# Patient Record
Sex: Female | Born: 1970 | Race: White | Hispanic: No | Smoking: Never smoker
Health system: Southern US, Community
[De-identification: ages and names within clinical notes are randomized; demographics above are authoritative.]

## PROBLEM LIST (undated history)

## (undated) DIAGNOSIS — E079 Disorder of thyroid, unspecified: Secondary | ICD-10-CM

---

## 2007-12-28 ENCOUNTER — Emergency Department (HOSPITAL_COMMUNITY): Admission: EM | Admit: 2007-12-28 | Discharge: 2007-12-28 | Payer: Self-pay | Admitting: Emergency Medicine

## 2008-01-22 ENCOUNTER — Emergency Department (HOSPITAL_COMMUNITY): Admission: EM | Admit: 2008-01-22 | Discharge: 2008-01-22 | Payer: Self-pay | Admitting: Emergency Medicine

## 2008-01-28 ENCOUNTER — Emergency Department (HOSPITAL_COMMUNITY): Admission: EM | Admit: 2008-01-28 | Discharge: 2008-01-29 | Payer: Self-pay | Admitting: Emergency Medicine

## 2008-02-08 ENCOUNTER — Emergency Department (HOSPITAL_COMMUNITY): Admission: EM | Admit: 2008-02-08 | Discharge: 2008-02-09 | Payer: Self-pay | Admitting: Emergency Medicine

## 2008-03-29 ENCOUNTER — Emergency Department (HOSPITAL_COMMUNITY): Admission: EM | Admit: 2008-03-29 | Discharge: 2008-03-30 | Payer: Self-pay | Admitting: Emergency Medicine

## 2008-05-01 ENCOUNTER — Emergency Department (HOSPITAL_COMMUNITY): Admission: EM | Admit: 2008-05-01 | Discharge: 2008-05-02 | Payer: Self-pay | Admitting: Emergency Medicine

## 2008-05-07 ENCOUNTER — Emergency Department (HOSPITAL_COMMUNITY): Admission: EM | Admit: 2008-05-07 | Discharge: 2008-05-07 | Payer: Self-pay | Admitting: Emergency Medicine

## 2008-09-07 ENCOUNTER — Emergency Department (HOSPITAL_COMMUNITY): Admission: EM | Admit: 2008-09-07 | Discharge: 2008-09-08 | Payer: Self-pay | Admitting: Emergency Medicine

## 2008-09-20 ENCOUNTER — Emergency Department (HOSPITAL_COMMUNITY): Admission: EM | Admit: 2008-09-20 | Discharge: 2008-09-20 | Payer: Self-pay | Admitting: Emergency Medicine

## 2008-09-27 ENCOUNTER — Emergency Department (HOSPITAL_COMMUNITY): Admission: EM | Admit: 2008-09-27 | Discharge: 2008-09-28 | Payer: Self-pay | Admitting: Emergency Medicine

## 2008-09-28 ENCOUNTER — Inpatient Hospital Stay (HOSPITAL_COMMUNITY): Admission: AD | Admit: 2008-09-28 | Discharge: 2008-09-30 | Payer: Self-pay | Admitting: Psychiatry

## 2008-09-28 ENCOUNTER — Ambulatory Visit: Payer: Self-pay | Admitting: Psychiatry

## 2008-10-05 ENCOUNTER — Emergency Department (HOSPITAL_COMMUNITY): Admission: EM | Admit: 2008-10-05 | Discharge: 2008-10-06 | Payer: Self-pay | Admitting: Emergency Medicine

## 2008-10-06 ENCOUNTER — Inpatient Hospital Stay (HOSPITAL_COMMUNITY): Admission: AD | Admit: 2008-10-06 | Discharge: 2008-10-06 | Payer: Self-pay | Admitting: Obstetrics and Gynecology

## 2008-10-07 ENCOUNTER — Inpatient Hospital Stay (HOSPITAL_COMMUNITY): Admission: AD | Admit: 2008-10-07 | Discharge: 2008-10-08 | Payer: Self-pay | Admitting: Obstetrics and Gynecology

## 2008-12-12 ENCOUNTER — Emergency Department (HOSPITAL_COMMUNITY): Admission: EM | Admit: 2008-12-12 | Discharge: 2008-12-12 | Payer: Self-pay | Admitting: Family Medicine

## 2008-12-14 ENCOUNTER — Emergency Department (HOSPITAL_COMMUNITY): Admission: EM | Admit: 2008-12-14 | Discharge: 2008-12-14 | Payer: Self-pay | Admitting: Emergency Medicine

## 2008-12-22 ENCOUNTER — Emergency Department (HOSPITAL_COMMUNITY): Admission: EM | Admit: 2008-12-22 | Discharge: 2008-12-22 | Payer: Self-pay | Admitting: *Deleted

## 2008-12-26 ENCOUNTER — Emergency Department (HOSPITAL_COMMUNITY): Admission: EM | Admit: 2008-12-26 | Discharge: 2008-12-26 | Payer: Self-pay | Admitting: Emergency Medicine

## 2009-04-23 ENCOUNTER — Emergency Department (HOSPITAL_COMMUNITY): Admission: EM | Admit: 2009-04-23 | Discharge: 2009-04-24 | Payer: Self-pay | Admitting: Emergency Medicine

## 2009-08-19 ENCOUNTER — Ambulatory Visit: Payer: Self-pay | Admitting: Family Medicine

## 2009-08-21 IMAGING — CR DG CHEST 2V
2 series · 2 of 2 positions shown · non-contrast
Comparison: None.

CLINICAL DATA: Chest pain and called.  Body aches.  Fever.

CHEST - 2 VIEW

[w chest pa]
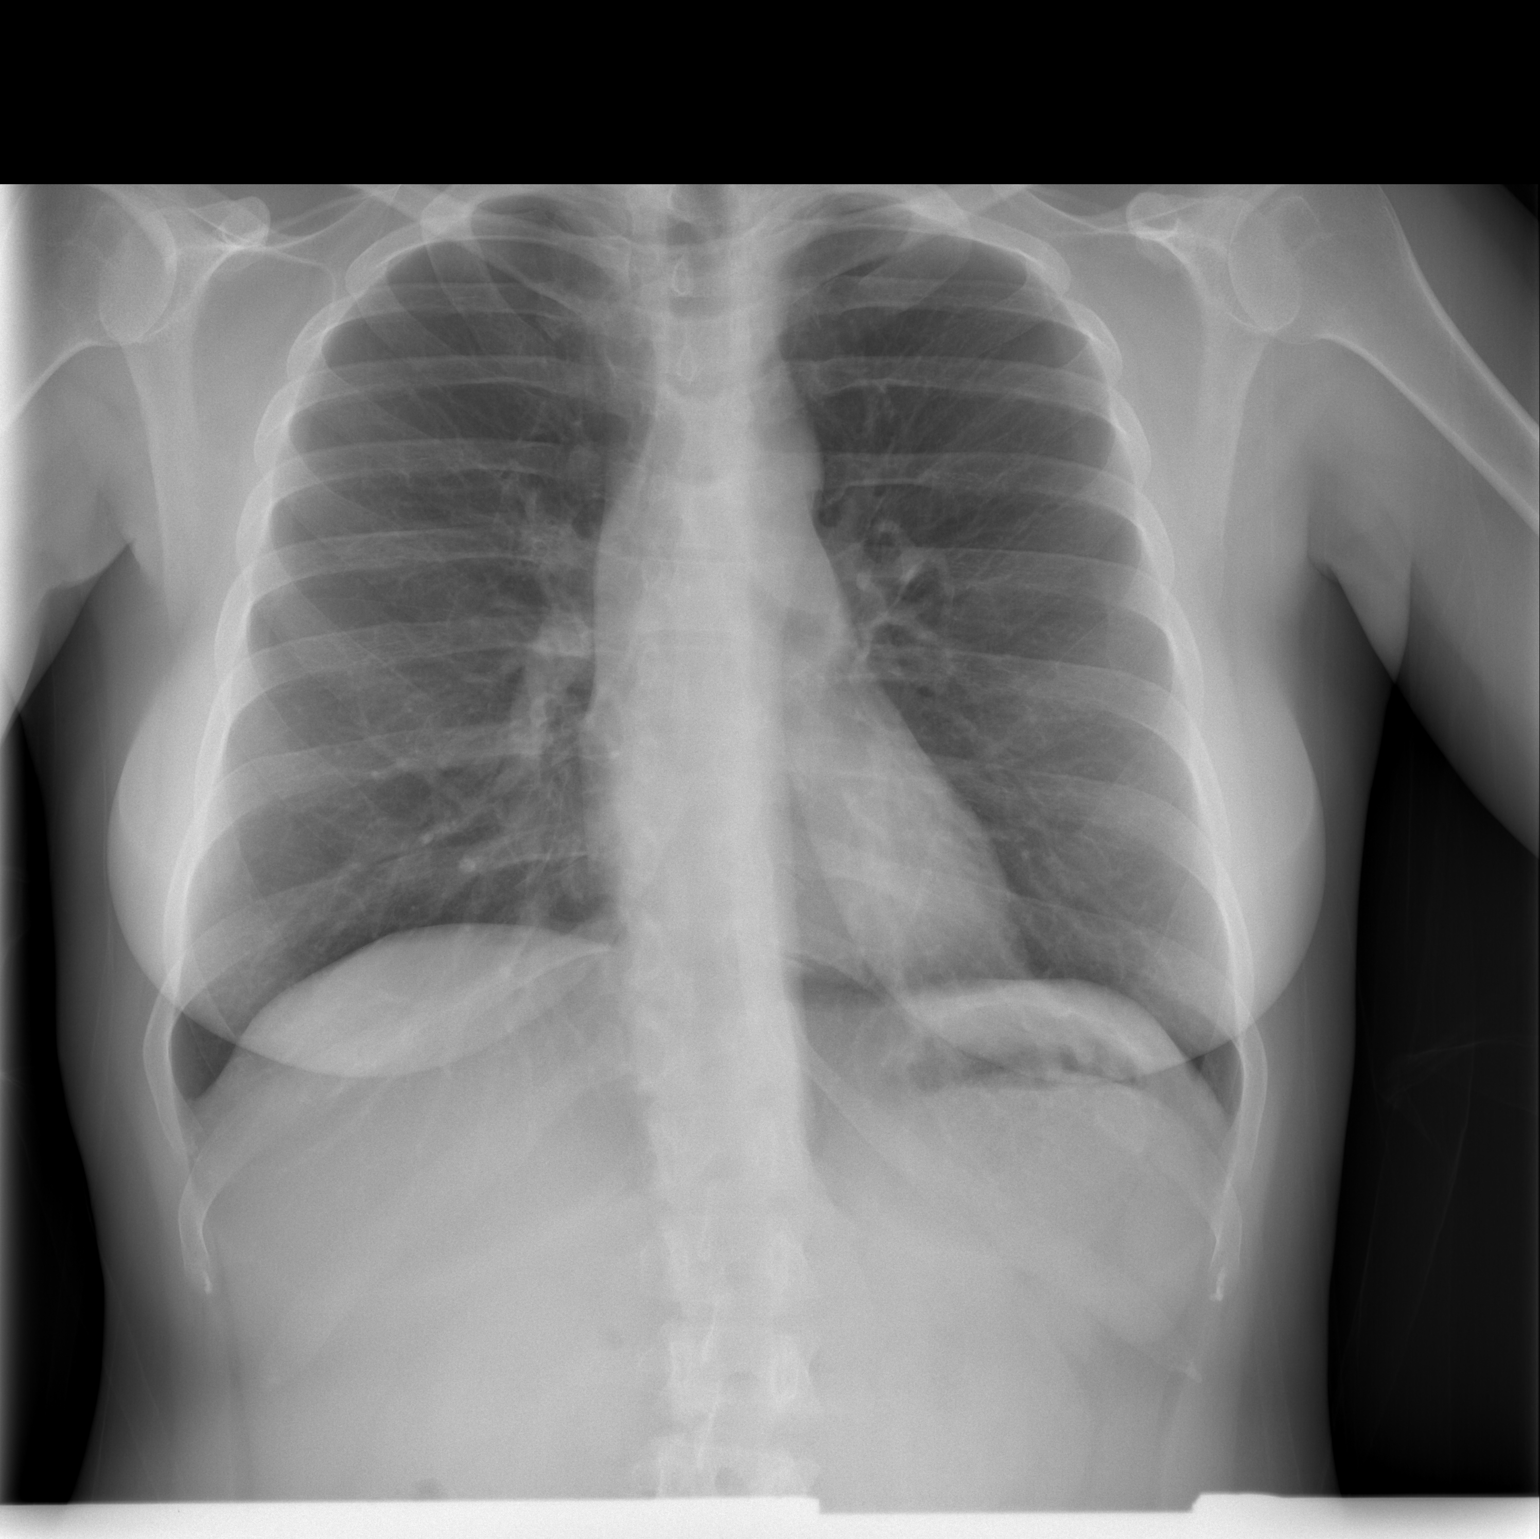

[w chest lat]
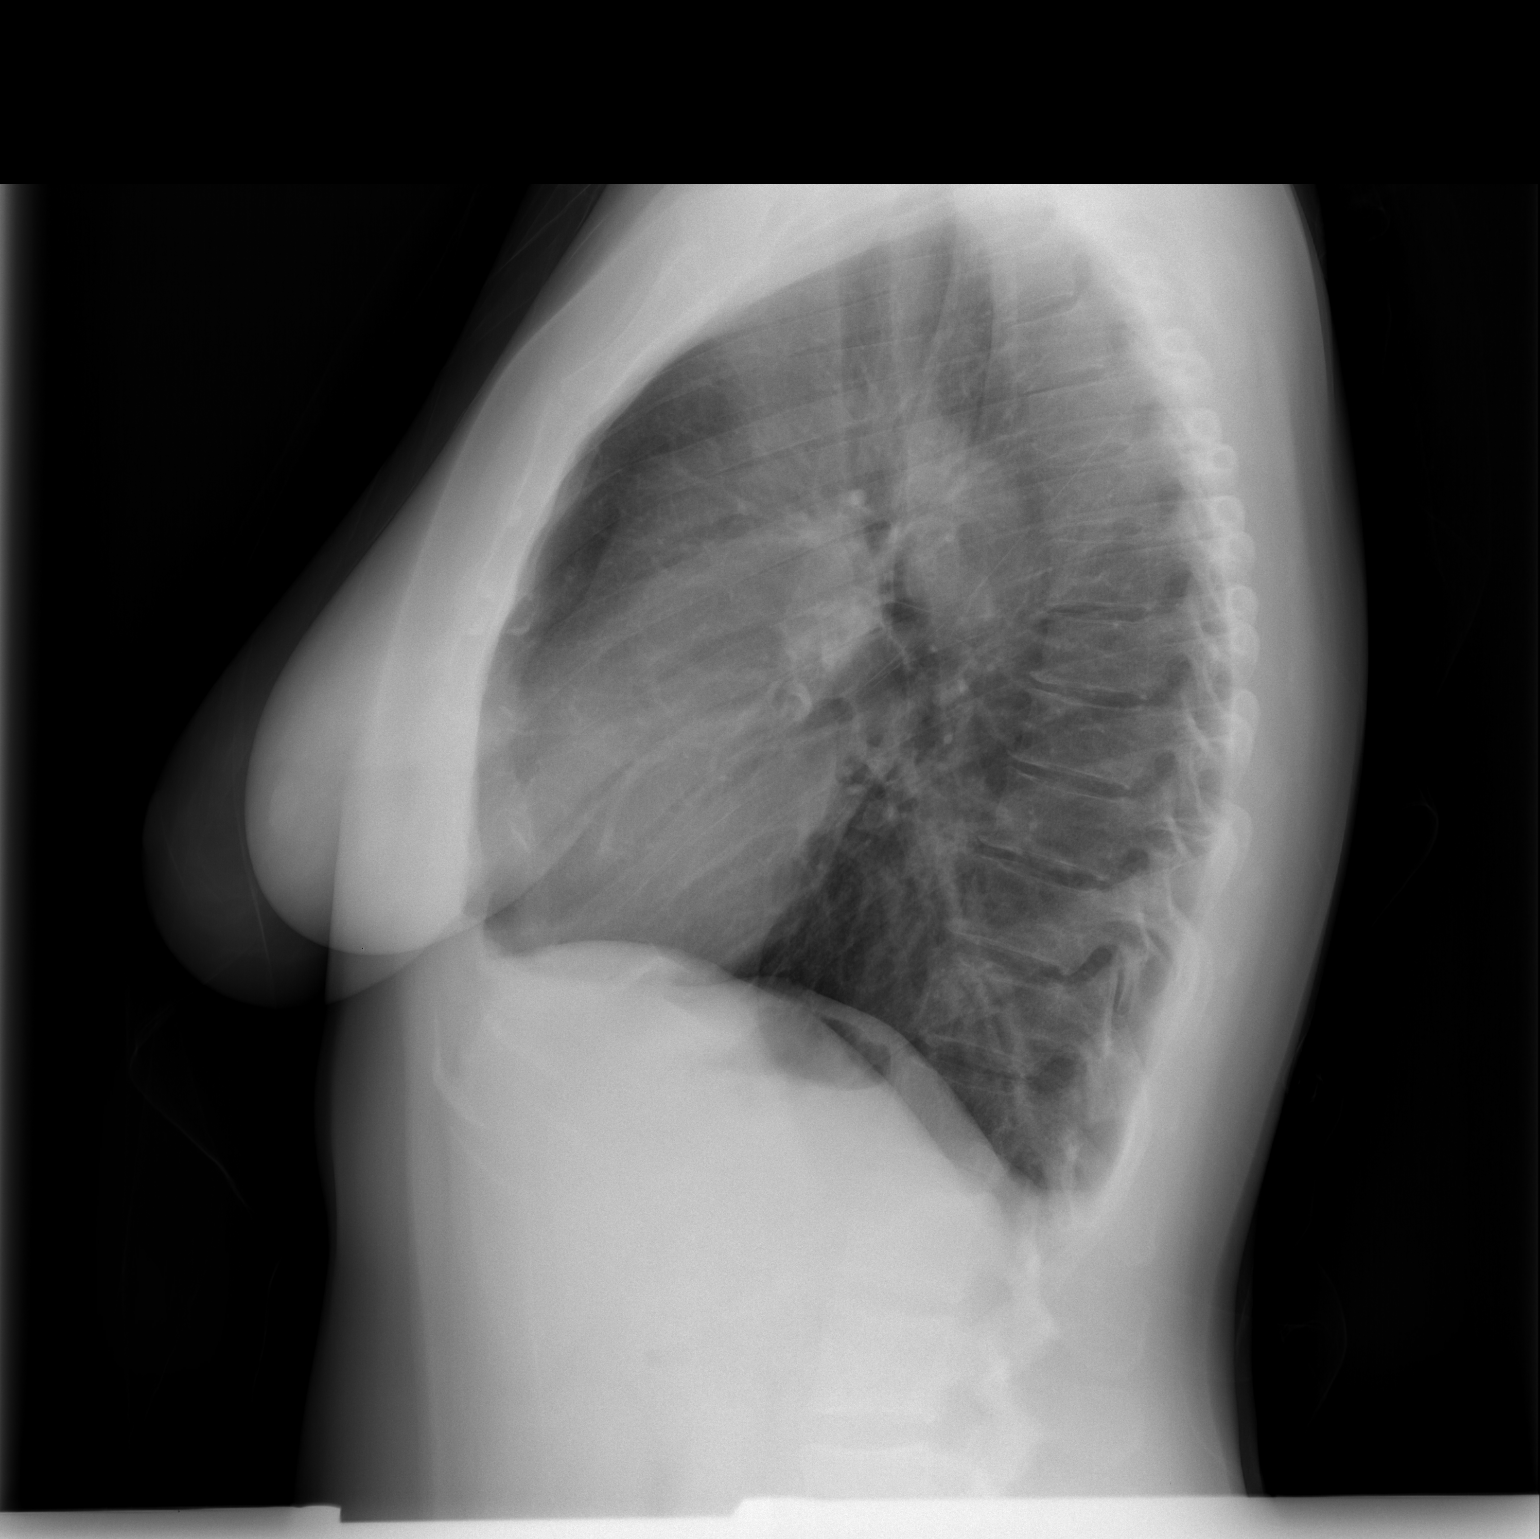

[2 of 2 positions shown; findings below may reference images not displayed]

FINDINGS: Mild dextroscoliosis of the thoracic spine.  Minimal
peribronchial thickening may represent minimal bronchitis type
changes.  No segmental infiltrate, congestive heart failure or
pneumothorax.  Heart size within normal limits.
IMPRESSION: Minimal peribronchial thickening may represent bronchitis type
changes.

No segmental infiltrate.

## 2009-09-16 ENCOUNTER — Ambulatory Visit: Payer: Self-pay | Admitting: Family Medicine

## 2009-10-14 ENCOUNTER — Ambulatory Visit: Payer: Self-pay | Admitting: Obstetrics & Gynecology

## 2009-10-14 ENCOUNTER — Ambulatory Visit (HOSPITAL_COMMUNITY): Admission: RE | Admit: 2009-10-14 | Discharge: 2009-10-14 | Payer: Self-pay | Admitting: Obstetrics & Gynecology

## 2009-10-28 ENCOUNTER — Ambulatory Visit (HOSPITAL_COMMUNITY): Admission: RE | Admit: 2009-10-28 | Discharge: 2009-10-28 | Payer: Self-pay | Admitting: Obstetrics & Gynecology

## 2009-10-28 ENCOUNTER — Ambulatory Visit: Payer: Self-pay | Admitting: Obstetrics & Gynecology

## 2009-11-18 ENCOUNTER — Ambulatory Visit: Payer: Self-pay | Admitting: Obstetrics & Gynecology

## 2009-12-09 ENCOUNTER — Ambulatory Visit: Payer: Self-pay | Admitting: Obstetrics & Gynecology

## 2009-12-09 ENCOUNTER — Encounter: Payer: Self-pay | Admitting: Obstetrics & Gynecology

## 2009-12-09 LAB — CONVERTED CEMR LAB
Hemoglobin: 11.4 g/dL — ABNORMAL LOW (ref 12.0–15.0)
RBC: 3.5 M/uL — ABNORMAL LOW (ref 3.87–5.11)
RDW: 13 % (ref 11.5–15.5)

## 2009-12-30 ENCOUNTER — Ambulatory Visit: Payer: Self-pay | Admitting: Obstetrics & Gynecology

## 2010-01-20 ENCOUNTER — Ambulatory Visit: Payer: Self-pay | Admitting: Family Medicine

## 2010-02-10 ENCOUNTER — Encounter: Payer: Self-pay | Admitting: Obstetrics & Gynecology

## 2010-02-10 ENCOUNTER — Ambulatory Visit: Payer: Self-pay | Admitting: Obstetrics and Gynecology

## 2010-02-10 LAB — CONVERTED CEMR LAB: GC Probe Amp, Genital: NEGATIVE

## 2010-02-11 ENCOUNTER — Encounter: Payer: Self-pay | Admitting: Family Medicine

## 2010-02-24 ENCOUNTER — Ambulatory Visit: Payer: Self-pay | Admitting: Obstetrics & Gynecology

## 2010-03-03 ENCOUNTER — Ambulatory Visit: Payer: Self-pay | Admitting: Obstetrics & Gynecology

## 2010-03-04 ENCOUNTER — Encounter: Payer: Self-pay | Admitting: Family Medicine

## 2010-03-04 ENCOUNTER — Ambulatory Visit (HOSPITAL_COMMUNITY): Admission: RE | Admit: 2010-03-04 | Discharge: 2010-03-04 | Payer: Self-pay | Admitting: Obstetrics & Gynecology

## 2010-03-10 ENCOUNTER — Ambulatory Visit: Payer: Self-pay | Admitting: Obstetrics and Gynecology

## 2010-03-11 ENCOUNTER — Ambulatory Visit: Payer: Self-pay | Admitting: Obstetrics & Gynecology

## 2010-03-14 ENCOUNTER — Ambulatory Visit: Payer: Self-pay | Admitting: Obstetrics & Gynecology

## 2010-03-14 ENCOUNTER — Observation Stay (HOSPITAL_COMMUNITY): Admission: AD | Admit: 2010-03-14 | Discharge: 2010-03-14 | Payer: Self-pay | Admitting: Obstetrics & Gynecology

## 2010-03-15 ENCOUNTER — Inpatient Hospital Stay (HOSPITAL_COMMUNITY): Admission: AD | Admit: 2010-03-15 | Discharge: 2010-03-17 | Payer: Self-pay | Admitting: Family Medicine

## 2010-03-15 ENCOUNTER — Ambulatory Visit: Payer: Self-pay | Admitting: Obstetrics & Gynecology

## 2010-05-28 ENCOUNTER — Emergency Department (HOSPITAL_COMMUNITY): Admission: EM | Admit: 2010-05-28 | Discharge: 2010-05-28 | Payer: Self-pay | Admitting: Family Medicine

## 2010-07-05 IMAGING — US US AMNIOCENTESIS
1 series · 3 of 3 positions shown · non-contrast
Comparison: none

OBSTETRICAL ULTRASOUND:
 This ultrasound exam was performed in the [HOSPITAL] Ultrasound Department.  The OB US report was generated in the AS system, and faxed to the ordering physician.  This report is also available in [HOSPITAL]?s Access[employer]ware and in [REDACTED] PACS.

[Series 1: us amniocentesis · 0.27mm/px · 3 of 3 slices shown]
[im 1/3]
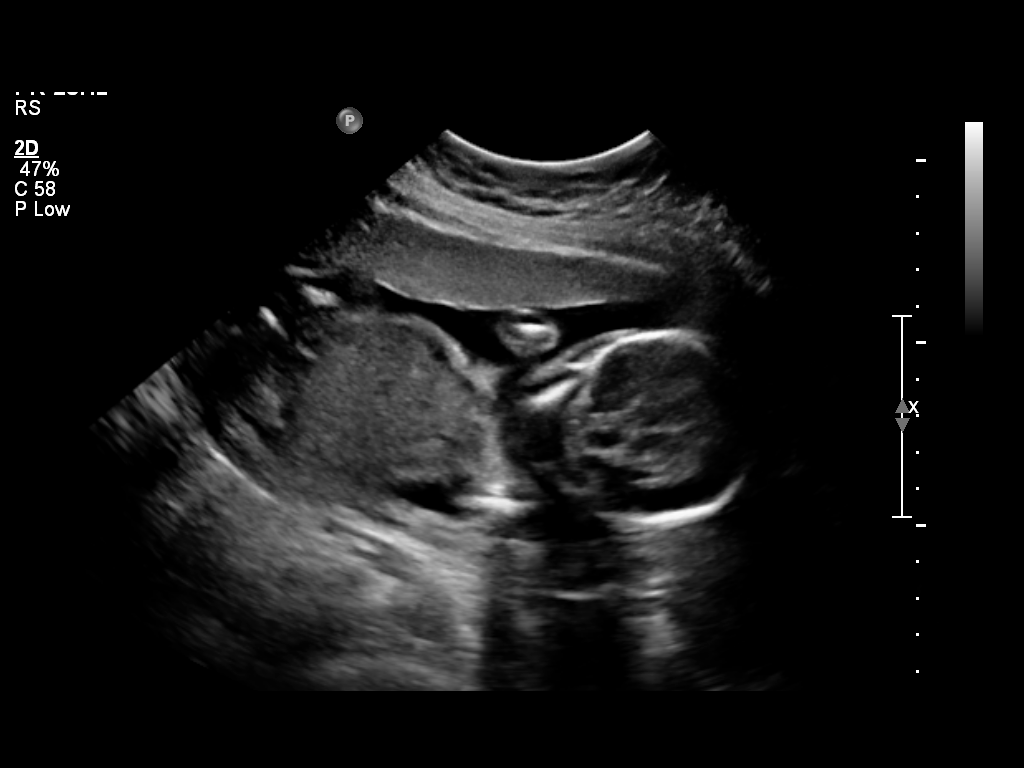
[im 2/3]
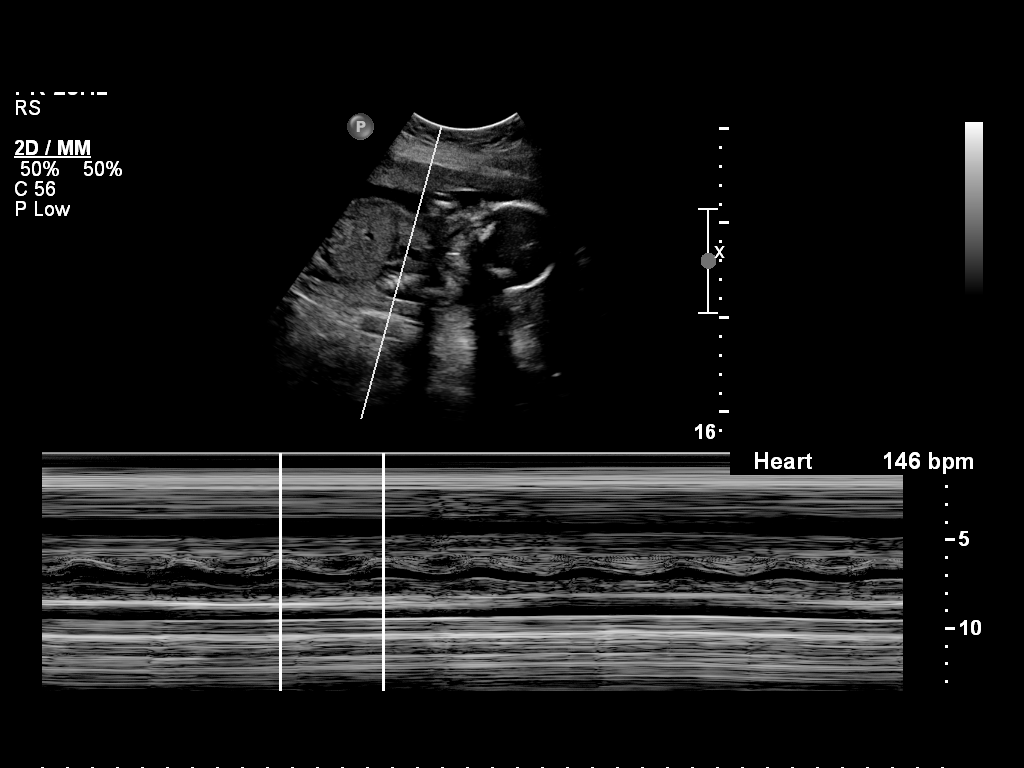
[im 3/3]
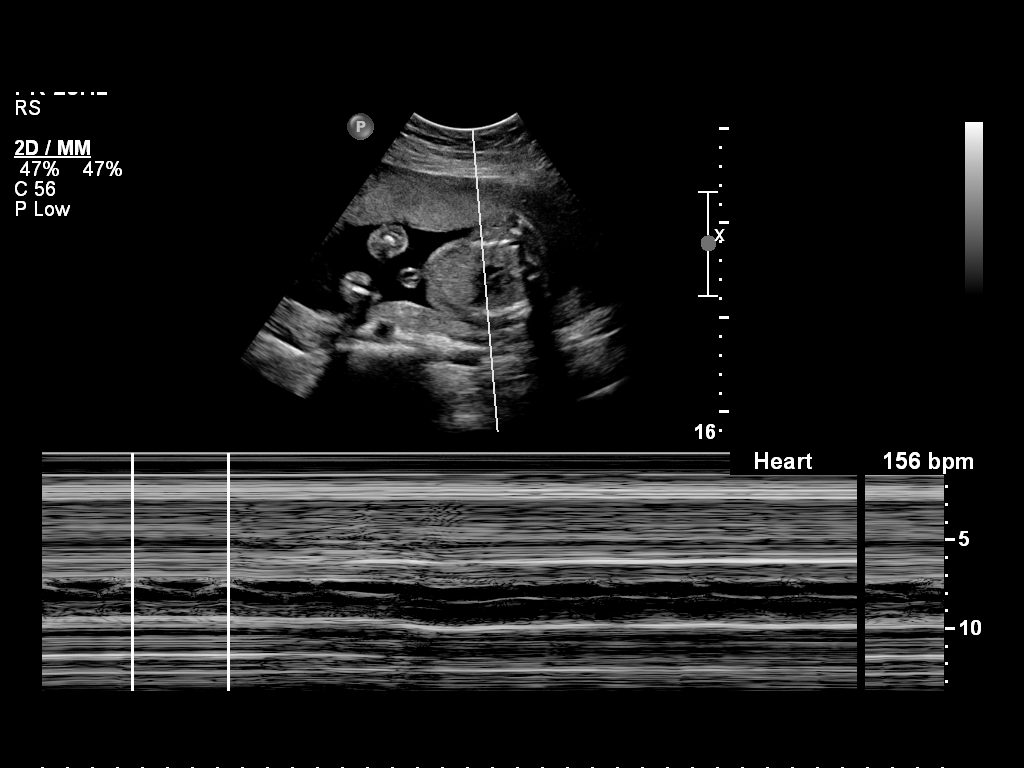

[3 of 3 positions shown; findings below may reference images not displayed]

IMPRESSION: See AS Obstetric US report.

## 2010-11-09 IMAGING — US US OB FOLLOW-UP
1 series · 14 of 27 positions shown · non-contrast
Comparison: none

OBSTETRICAL ULTRASOUND:
 This ultrasound exam was performed in the [HOSPITAL] Ultrasound Department.  The OB US report was generated in the AS system, and faxed to the ordering physician.  This report is also available in [HOSPITAL]?s [employer] and in [REDACTED] PACS.

[Series 1: us ob follow up · 0.33mm/px · 14 of 27 slices shown]
[im 1/27]
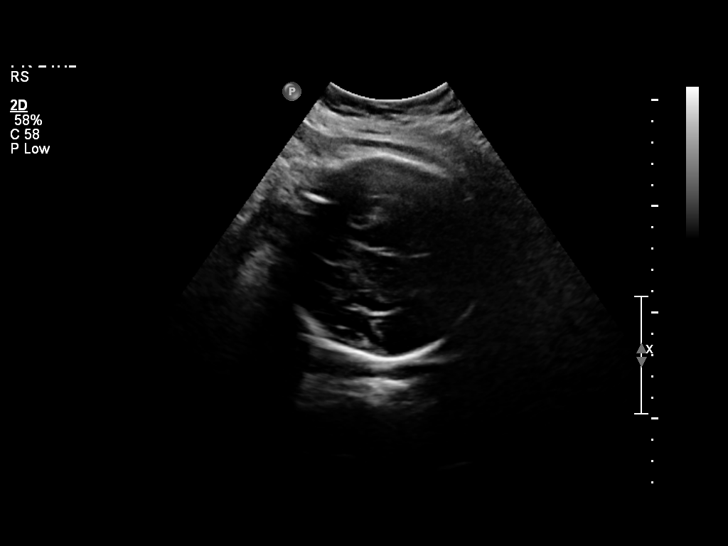
[im 3/27]
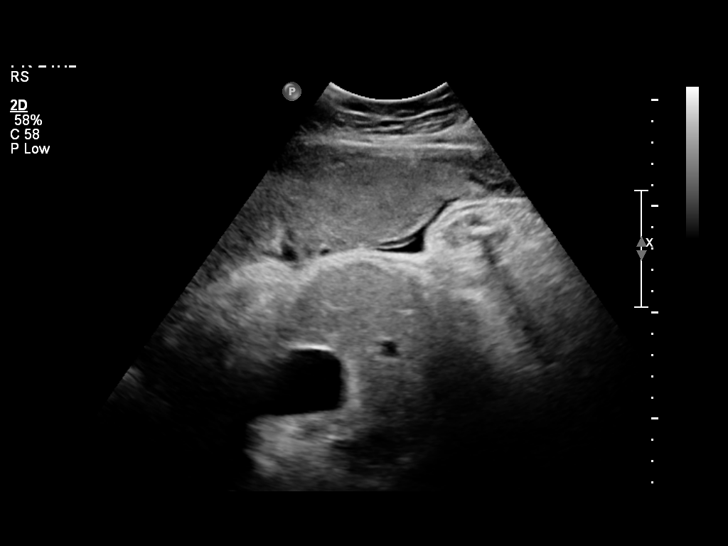
[im 5/27]
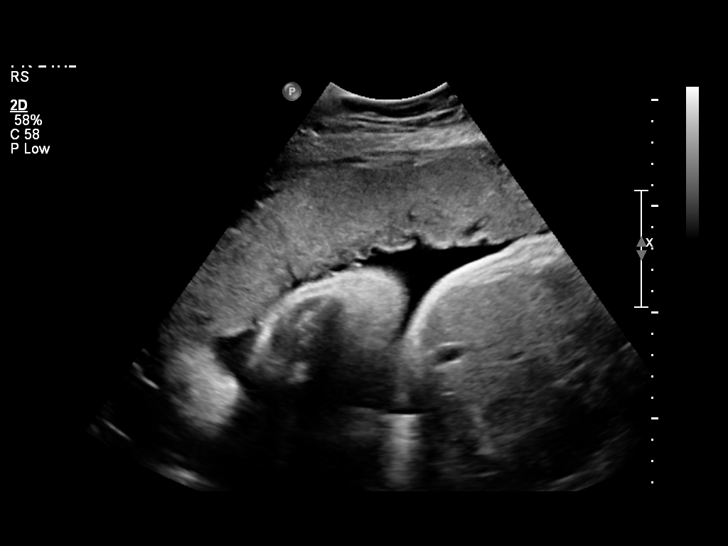
[im 7/27]
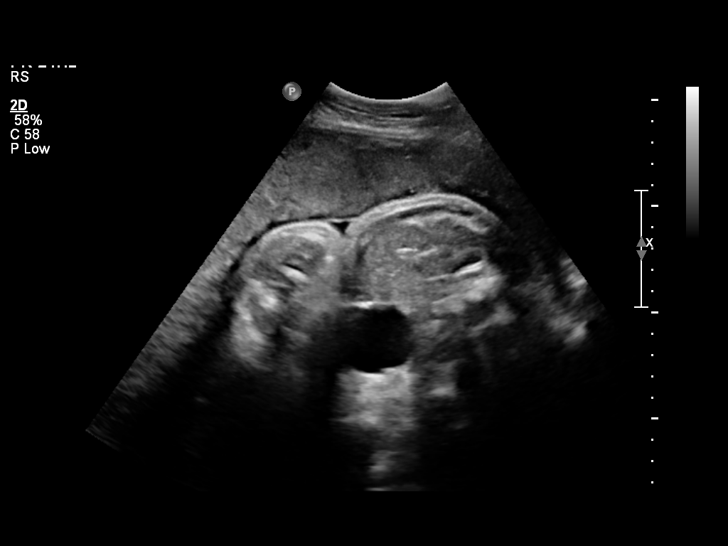
[im 9/27]
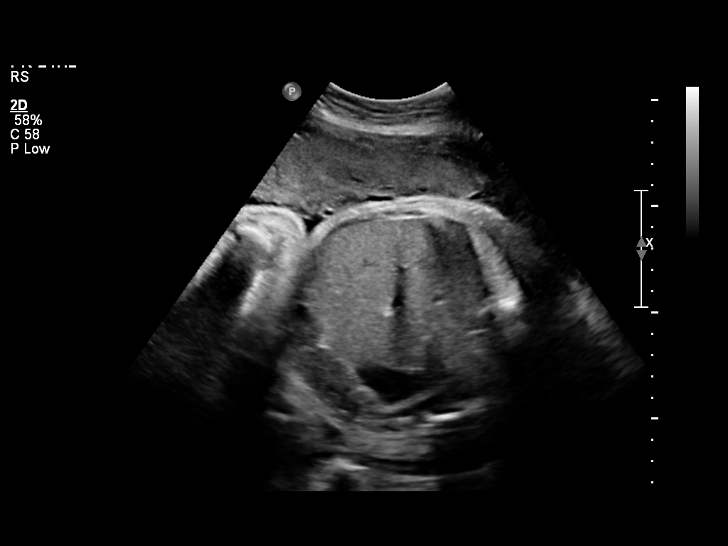
[im 11/27]
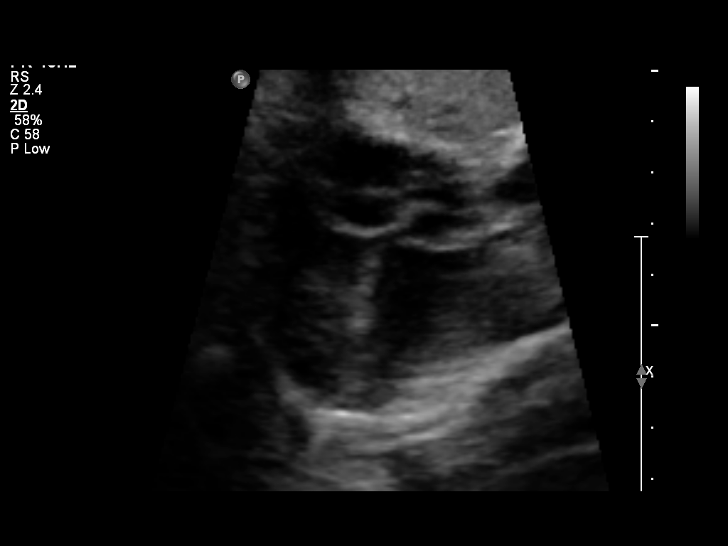
[im 13/27]
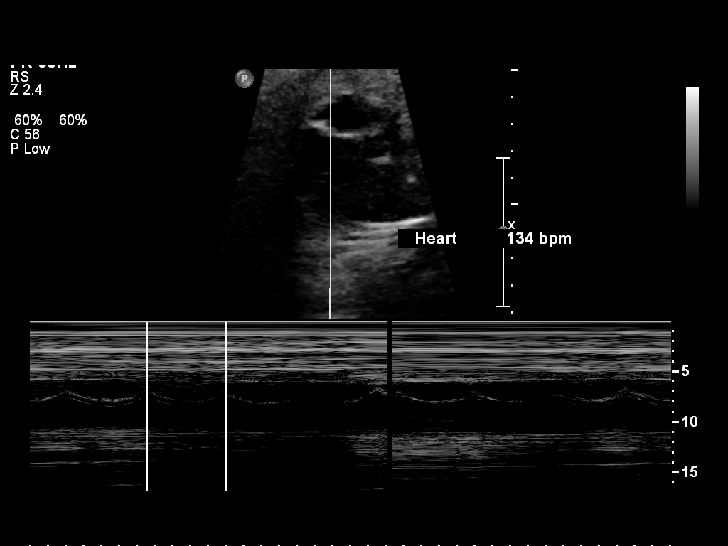
[im 15/27]
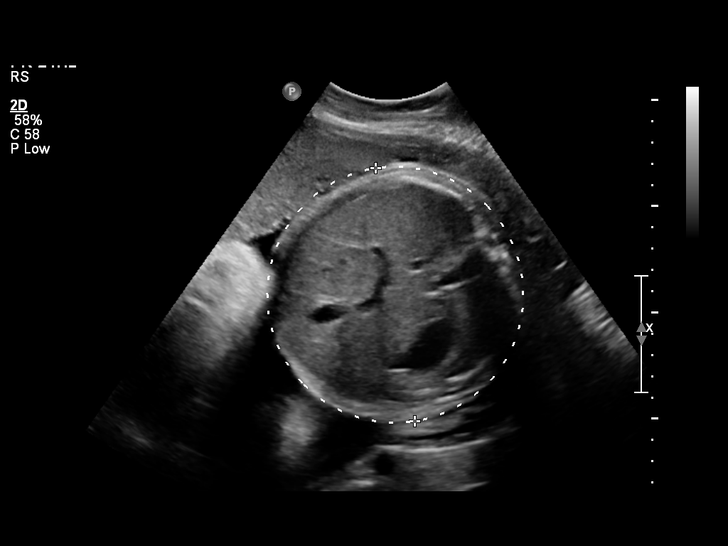
[im 17/27]
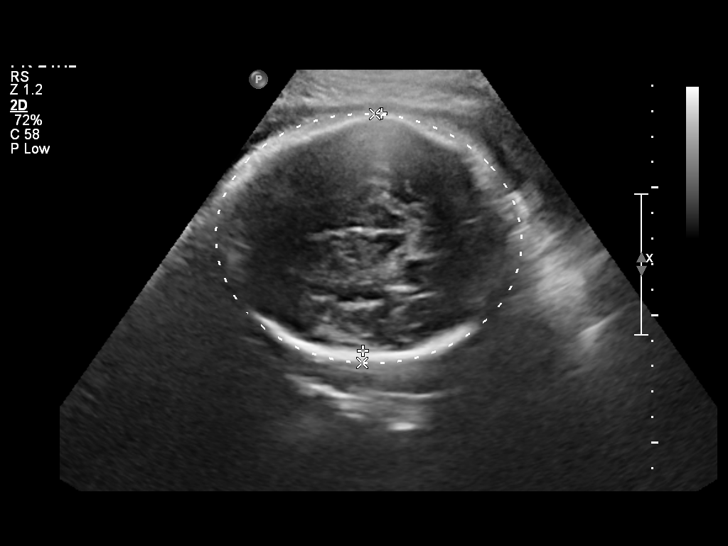
[im 19/27]
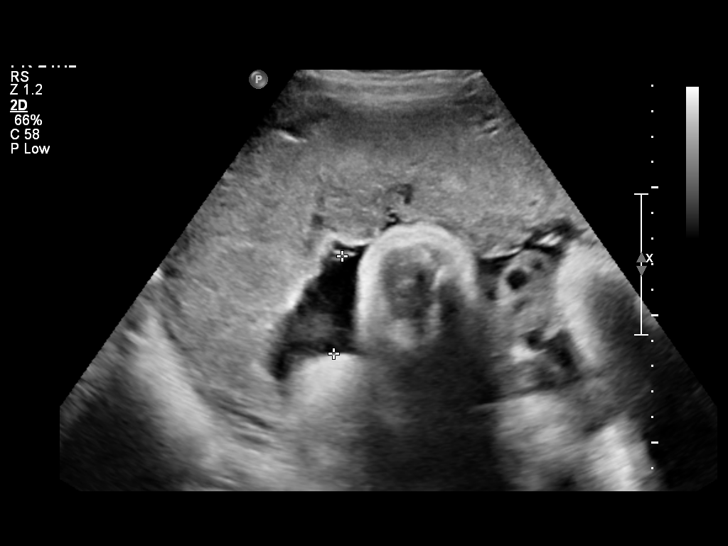
[im 21/27]
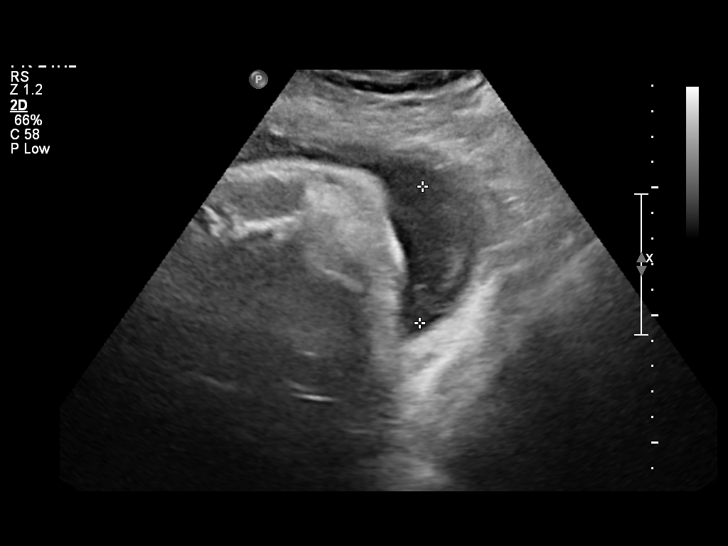
[im 23/27]
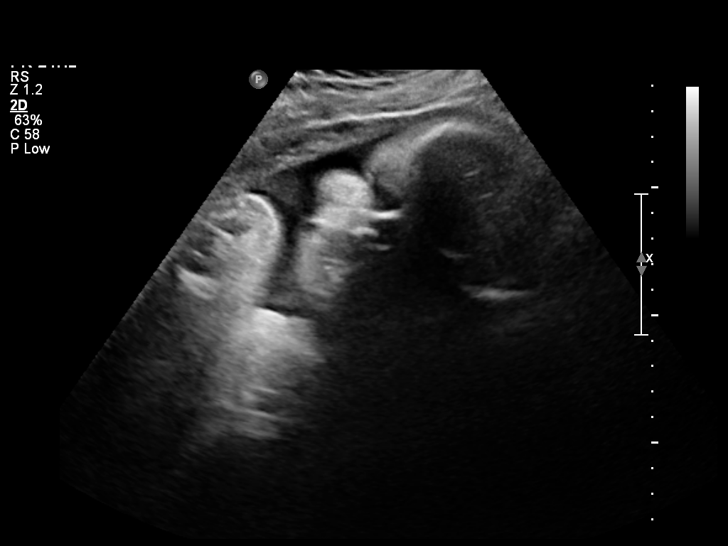
[im 25/27]
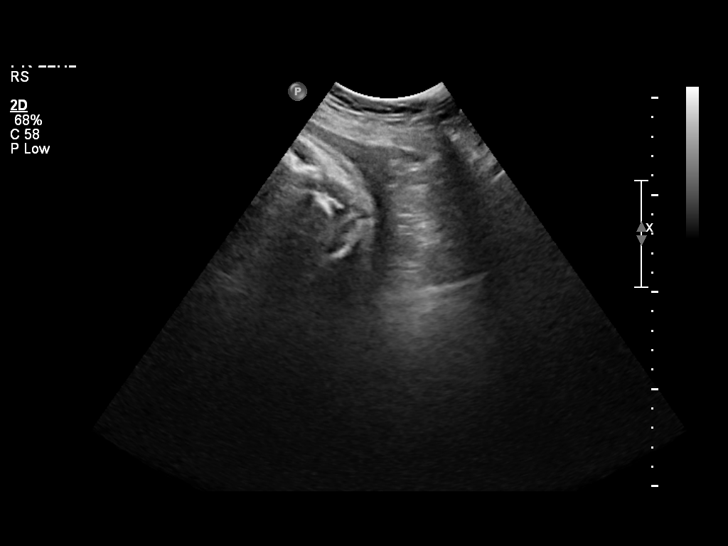
[im 27/27]
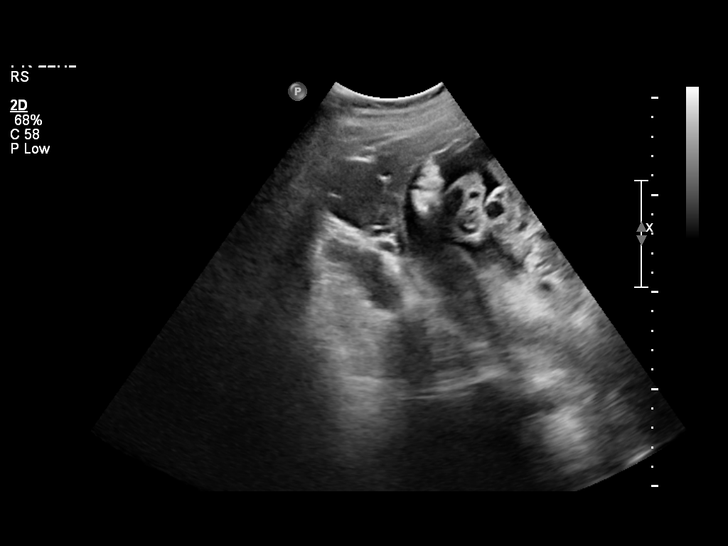

[14 of 27 positions shown; findings below may reference images not displayed]

IMPRESSION: See AS Obstetric US report.

## 2011-01-29 ENCOUNTER — Telehealth: Payer: Self-pay | Admitting: *Deleted

## 2011-01-29 ENCOUNTER — Other Ambulatory Visit: Payer: Self-pay | Admitting: Family Medicine

## 2011-01-31 NOTE — Telephone Encounter (Signed)
Issue resolved.

## 2011-03-05 LAB — POCT URINALYSIS DIP (DEVICE)
Bilirubin Urine: NEGATIVE
Glucose, UA: NEGATIVE mg/dL
Hgb urine dipstick: NEGATIVE
Ketones, ur: NEGATIVE mg/dL
pH: 6 (ref 5.0–8.0)

## 2011-03-08 LAB — POCT URINALYSIS DIP (DEVICE)
Bilirubin Urine: NEGATIVE
Hgb urine dipstick: NEGATIVE
Nitrite: NEGATIVE
Protein, ur: NEGATIVE mg/dL
Protein, ur: NEGATIVE mg/dL
Specific Gravity, Urine: 1.01 (ref 1.005–1.030)
Specific Gravity, Urine: 1.02 (ref 1.005–1.030)
pH: 6.5 (ref 5.0–8.0)

## 2011-03-13 LAB — POCT URINALYSIS DIP (DEVICE)
Bilirubin Urine: NEGATIVE
Glucose, UA: NEGATIVE mg/dL
Glucose, UA: NEGATIVE mg/dL
Glucose, UA: NEGATIVE mg/dL
Hgb urine dipstick: NEGATIVE
Ketones, ur: NEGATIVE mg/dL
Nitrite: NEGATIVE
Protein, ur: NEGATIVE mg/dL
Protein, ur: NEGATIVE mg/dL
Protein, ur: NEGATIVE mg/dL
Specific Gravity, Urine: 1.02 (ref 1.005–1.030)
Specific Gravity, Urine: 1.02 (ref 1.005–1.030)
Urobilinogen, UA: 0.2 mg/dL (ref 0.0–1.0)

## 2011-03-13 LAB — RH IMMUNE GLOB WKUP(>/=20WKS)(NOT WOMEN'S HOSP): Antibody Screen: NEGATIVE

## 2011-03-13 LAB — CBC
HCT: 33 % — ABNORMAL LOW (ref 36.0–46.0)
Hemoglobin: 10.1 g/dL — ABNORMAL LOW (ref 12.0–15.0)
Hemoglobin: 11.4 g/dL — ABNORMAL LOW (ref 12.0–15.0)
MCHC: 34.1 g/dL (ref 30.0–36.0)
MCV: 93.4 fL (ref 78.0–100.0)
Platelets: 140 10*3/uL — ABNORMAL LOW (ref 150–400)
Platelets: 141 10*3/uL — ABNORMAL LOW (ref 150–400)
RBC: 3.19 MIL/uL — ABNORMAL LOW (ref 3.87–5.11)
RBC: 3.58 MIL/uL — ABNORMAL LOW (ref 3.87–5.11)
RBC: 3.67 MIL/uL — ABNORMAL LOW (ref 3.87–5.11)
RDW: 13.2 % (ref 11.5–15.5)
RDW: 13.6 % (ref 11.5–15.5)
WBC: 10.6 10*3/uL — ABNORMAL HIGH (ref 4.0–10.5)

## 2011-03-13 LAB — RPR: RPR Ser Ql: NONREACTIVE

## 2011-03-20 LAB — POCT URINALYSIS DIP (DEVICE)
Ketones, ur: NEGATIVE mg/dL
Nitrite: NEGATIVE
Specific Gravity, Urine: 1.015 (ref 1.005–1.030)
Urobilinogen, UA: 0.2 mg/dL (ref 0.0–1.0)

## 2011-03-21 LAB — POCT URINALYSIS DIP (DEVICE)
Bilirubin Urine: NEGATIVE
Ketones, ur: NEGATIVE mg/dL
Nitrite: NEGATIVE
Protein, ur: NEGATIVE mg/dL
Urobilinogen, UA: 0.2 mg/dL (ref 0.0–1.0)

## 2011-03-22 LAB — POCT URINALYSIS DIP (DEVICE)
Glucose, UA: NEGATIVE mg/dL
Hgb urine dipstick: NEGATIVE

## 2011-03-22 LAB — CHROMOSOME ANALYSIS, AMNIOTIC FLUID (PERF AT WFU)

## 2011-03-23 LAB — POCT URINALYSIS DIP (DEVICE)
Ketones, ur: NEGATIVE mg/dL
Protein, ur: NEGATIVE mg/dL
Specific Gravity, Urine: 1.015 (ref 1.005–1.030)

## 2011-03-24 LAB — POCT URINALYSIS DIP (DEVICE)
Bilirubin Urine: NEGATIVE
Glucose, UA: NEGATIVE mg/dL
Hgb urine dipstick: NEGATIVE
Hgb urine dipstick: NEGATIVE
Ketones, ur: NEGATIVE mg/dL
Specific Gravity, Urine: 1.025 (ref 1.005–1.030)
Specific Gravity, Urine: <=1.005 (ref 1.005–1.030)
Urobilinogen, UA: 0.2 mg/dL (ref 0.0–1.0)
pH: 6 (ref 5.0–8.0)

## 2011-03-28 LAB — DIFFERENTIAL
Basophils Absolute: 0 10*3/uL (ref 0.0–0.1)
Basophils Relative: 1 % (ref 0–1)
Eosinophils Absolute: 0 10*3/uL (ref 0.0–0.7)
Eosinophils Relative: 0 % (ref 0–5)
Lymphocytes Relative: 43 % (ref 12–46)
Lymphs Abs: 2 10*3/uL (ref 0.7–4.0)
Monocytes Absolute: 0.2 10*3/uL (ref 0.1–1.0)
Monocytes Relative: 4 % (ref 3–12)
Neutro Abs: 2.4 10*3/uL (ref 1.7–7.7)
Neutrophils Relative %: 52 % (ref 43–77)

## 2011-03-28 LAB — URINE MICROSCOPIC-ADD ON

## 2011-03-28 LAB — CBC
HCT: 36.7 % (ref 36.0–46.0)
Hemoglobin: 12.8 g/dL (ref 12.0–15.0)
MCHC: 34.8 g/dL (ref 30.0–36.0)
MCV: 101.7 fL — ABNORMAL HIGH (ref 78.0–100.0)
Platelets: 279 10*3/uL (ref 150–400)
RBC: 3.61 MIL/uL — ABNORMAL LOW (ref 3.87–5.11)
RDW: 17.2 % — ABNORMAL HIGH (ref 11.5–15.5)
WBC: 4.5 10*3/uL (ref 4.0–10.5)

## 2011-03-28 LAB — RAPID URINE DRUG SCREEN, HOSP PERFORMED
Barbiturates: NOT DETECTED
Opiates: NOT DETECTED

## 2011-03-28 LAB — URINALYSIS, ROUTINE W REFLEX MICROSCOPIC
Bilirubin Urine: NEGATIVE
Glucose, UA: NEGATIVE mg/dL
Specific Gravity, Urine: 1.006 (ref 1.005–1.030)
pH: 6 (ref 5.0–8.0)

## 2011-03-28 LAB — COMPREHENSIVE METABOLIC PANEL
CO2: 12 mEq/L — ABNORMAL LOW (ref 19–32)
Calcium: 9 mg/dL (ref 8.4–10.5)
Glucose, Bld: 70 mg/dL (ref 70–99)
Sodium: 141 mEq/L (ref 135–145)

## 2011-04-03 LAB — COMPREHENSIVE METABOLIC PANEL
AST: 29 U/L (ref 0–37)
Alkaline Phosphatase: 102 U/L (ref 39–117)
BUN: 6 mg/dL (ref 6–23)
BUN: 4 mg/dL — ABNORMAL LOW (ref 6–23)
CO2: 23 mEq/L (ref 19–32)
Chloride: 106 mEq/L (ref 96–112)
Creatinine, Ser: 0.76 mg/dL (ref 0.4–1.2)
GFR calc Af Amer: >60 mL/min (ref >60)
GFR calc non Af Amer: >60 mL/min (ref >60)
GFR calc non Af Amer: >60 mL/min (ref >60)
Glucose, Bld: 100 mg/dL — ABNORMAL HIGH (ref 70–99)
Glucose, Bld: 63 mg/dL — ABNORMAL LOW (ref 70–99)
Potassium: 3.6 mEq/L (ref 3.5–5.1)
Total Bilirubin: 0.5 mg/dL (ref 0.3–1.2)
Total Protein: 6.8 g/dL (ref 6.0–8.3)

## 2011-04-03 LAB — URINE MICROSCOPIC-ADD ON

## 2011-04-03 LAB — PREGNANCY, URINE: Preg Test, Ur: NEGATIVE

## 2011-04-03 LAB — DIFFERENTIAL
Basophils Absolute: 0 10*3/uL (ref 0.0–0.1)
Basophils Absolute: 0 10*3/uL (ref 0.0–0.1)
Basophils Relative: 1 % (ref 0–1)
Eosinophils Relative: 1 % (ref 0–5)
Lymphocytes Relative: 45 % (ref 12–46)
Monocytes Relative: 3 % (ref 3–12)
Neutro Abs: 4.4 10*3/uL (ref 1.7–7.7)
Neutrophils Relative %: 51 % (ref 43–77)
Neutrophils Relative %: 66 % (ref 43–77)

## 2011-04-03 LAB — CBC
HCT: 44.6 % (ref 36.0–46.0)
HCT: 40.8 % (ref 36.0–46.0)
Hemoglobin: 15.7 g/dL — ABNORMAL HIGH (ref 12.0–15.0)
Hemoglobin: 13.8 g/dL (ref 12.0–15.0)
MCHC: 35.3 g/dL (ref 30.0–36.0)
MCHC: 33.9 g/dL (ref 30.0–36.0)
MCV: 91.8 fL (ref 78.0–100.0)
RBC: 4.86 MIL/uL (ref 3.87–5.11)
RDW: 14.5 % (ref 11.5–15.5)
WBC: 8 10*3/uL (ref 4.0–10.5)

## 2011-04-03 LAB — URINALYSIS, ROUTINE W REFLEX MICROSCOPIC
Bilirubin Urine: NEGATIVE
Bilirubin Urine: NEGATIVE
Glucose, UA: NEGATIVE mg/dL
Ketones, ur: 40 mg/dL — AB
Nitrite: NEGATIVE
Protein, ur: NEGATIVE mg/dL
Specific Gravity, Urine: 1.004 — ABNORMAL LOW (ref 1.005–1.030)
pH: 7 (ref 5.0–8.0)

## 2011-04-03 LAB — TRICYCLICS SCREEN, URINE: TCA Scrn: NOT DETECTED

## 2011-04-03 LAB — ETHANOL: Alcohol, Ethyl (B): 358 mg/dL — ABNORMAL HIGH (ref 0–10)

## 2011-04-03 LAB — RAPID URINE DRUG SCREEN, HOSP PERFORMED
Barbiturates: NOT DETECTED
Cocaine: NOT DETECTED
Cocaine: NOT DETECTED
Opiates: NOT DETECTED

## 2011-04-03 LAB — LIPASE, BLOOD: Lipase: 35 U/L (ref 11–59)

## 2011-05-02 NOTE — H&P (Signed)
Penny Hill, Penny Hill                  ACCOUNT NO.:  [id number]   MEDICAL RECORD NO.:  [id number]          PATIENT TYPE:  IPS   LOCATION:  0507                          FACILITY:  [facility]   PHYSICIAN:  Landry Corporal, N.P.    DATE OF BIRTH:  October 27, 1971   DATE OF ADMISSION:  09/28/2008  DATE OF DISCHARGE:                       PSYCHIATRIC ADMISSION ASSESSMENT   HISTORY OF PRESENT ILLNESS:  The patient presents with a history of  alcohol abuse, has been drinking up to fifth at a time, is currently  pregnant, wanting to be detoxed.  Her last drink was yesterday.  She  does report a history of seizures with alcohol withdrawal.  She does  report that she fell about a week ago and had hurt her abdomen, and did  seek treatment in the emergency department.  She denies any thoughts to  harm herself or her child.  She denies any drug use and again is  requesting help with her alcohol problems.   PAST PSYCHIATRIC HISTORY:  First admission to the [facility], has been in [facility] in the past, and was detoxed back in  June.  Her longest history of sobriety has been 3 years.  The patient  apparently was drinking some before she was pregnant and has done some  recent binge drinking.   SOCIAL HISTORY:  A 40 year old divorced female who has a fiance that she  states is supportive.  She is unemployed.  She lives in [place].  She  has a 88-year-old daughter that is in her parents' custody.   FAMILY HISTORY:  None.   ALCOHOL/DRUG HISTORY:  As above.   PRIMARY CARE Wyeth Hoffer:  [facility] practice in [place].   MEDICAL PROBLEMS:  The patient is approximately 6-[redacted] weeks pregnant and  has not had her first prenatal visit.   MEDICATIONS:  Prenatal vitamins, no other medications.   DRUG ALLERGIES:  Levaquin.   PHYSICAL EXAMINATION:  The patient appears in no acute distress at the  time she was assessed at the [facility] emergency department.  She did  receive IV fluids,  some Ativan, and a breathing treatment.  Reported  that the patient had a history of wheezing.  Her O2 sat at this time is  100%.  Temperature 98.2, heart rate 82, respirations 12, blood pressure  115/49, 100% saturated.  Alcohol level was 296.  Her liver enzymes were  within normal limits.  Urine drug screen was negative.   MENTAL STATUS EXAM:  The patient is resting at this time in bed, does  awaken easily, cooperative with the interview.  Speech is soft spoken,  but clear.  The patient's mood is neutral.  The patient's affect is  sleepy and in no acute distress.  Thought processes are coherent and  goal-directed.  No evidence of any suicidal thoughts.  No delusional  statements.  Cognitive function intact.  Her memory appears to be good.  Judgment and insight are fair.   DIAGNOSES:  AXIS I:  Alcohol intoxication, rule out alcohol dependence.  AXIS II:  Deferred.  AXIS III:  Intrauterine pregnancy  at 6-8 weeks.  AXIS IV:  Psychosocial problems with chronic substance use.  Currently  pregnant.  AXIS V:  Current is 39.   PLAN:  Detox the patient.  Librium protocol.  We did call [facility] to obtain recommendations.  Spoke to the nurse practitioner who  talked to Dr. Penne Lash and Dr. Pierre Bali who agreed that the Librium  protocol would be recommended for detox.  The patient will be in the red  group.  We will continue to assess co-morbidities, assess support,  consider family session with the patient's fiance.  The patient will be  encouraged to follow up with her prenatal care with [facility]  practice.  Her tentative length of stay at this time is 3-4 days.  We  will put the patient on seizure precautions.  Length of stay is 3-4  days.      Landry Corporal, N.P.     JO/MEDQ  D:  09/28/2008  T:  09/28/2008  Job:  [id number]

## 2011-05-05 NOTE — Discharge Summary (Signed)
Penny Hill, Penny Hill                  ACCOUNT NO.:  [id number]   MEDICAL RECORD NO.:  [id number]         PATIENT TYPE:  BIPS   LOCATION:                                FACILITY:  [facility]   PHYSICIAN:  Geoffery Lyons, M.D.      DATE OF BIRTH:  [birth year]   DATE OF ADMISSION:  09/28/2008  DATE OF DISCHARGE:  09/30/2008                               DISCHARGE SUMMARY   CHIEF COMPLAINT:  This was the first admission to [facility] for this 40 year old female who presented with a history of  alcohol dependence.  Had been drinking up to a fifth at that time,  currently pregnant, wanting to be detoxed.  Last drink was the day  before.   PAST MEDICAL HISTORY:  Seizures with alcohol withdrawal.  Does report  that she fell about a week prior to this admission and had hurt her  abdomen and did seek treatment in the ED.  Requesting help for her  alcohol problem.  First time at [facility].  Has been in  [facility] and was detoxed back in June.  Longest sobriety 3 years.  Was drinking some before she was pregnant and has done some recent binge  drinking.  Secondary history as already stated.  Persistent use of  alcohol.   MEDICAL HISTORY:  She is 6 to [redacted] weeks pregnant.   MEDICATIONS:  Prenatal vitamins.   Physical exam failed to show any acute findings.   LABORATORY WORK:  Alcohol level was 296.  Liver enzymes were within  normal limits.  UDS point negative for substances of abuse.   PHYSICAL EXAMINATION:  PSYCHIATRIC:  Reveals alert cooperative female.  Speech was soft spoken, clear.  The patient is anxious.  She is tired.  Thought processes logical, coherent and relevant.  Wants to quit  drinking.  Concerned about the baby.  No active suicidal or homicidal  ideas, no delusions.  No hallucinations.  Cognition, well preserved.   ADMITTING DIAGNOSES:  AXIS I:  Alcohol dependence.  AXIS II:  No diagnosis.  AXIS III:  In intrauterine pregnancy 6 to 8 weeks.  AXIS IV:   Moderate.  AXIS V:  On admission 35-40.  Highest in the last year 60.   COURSE IN THE HOSPITAL:  She was admitted, started individual and group  psychotherapy.  We detoxified with Librium.  We maintained the vitamins.  As already stated, a 40 year old female, [redacted] weeks pregnant who endorsed  that she relapsed on alcohol.  Said that she was under a lot of stress.  She took a drink and continued drinking for several days.  She says she  got back to her senses as she realized she was pregnant and she really  wants this baby.  The fiancee is supportive.  Claims is a good  relationship.  Claimed that the stress was secondary to her 25-year-old  daughter being with her mother.  She gave her parents temporary custody  of her daughter after she went to [facility].  Also unemployed.  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  She  was seen [facility] a year ago with halfway house afterward.  Did  admit to depressed mood.  Also anxiety, worries, ruminating.  October  13, she endorsed she was feeling better.  She states she was committed  to abstinence.  Wanted to get herself back together, get her life with  fiancee and daughter together.  Looking back she says that once she  starts drinking, she is afraid that she is going to have seizures and  that is what keeps her drinking.  She was wanting to go home.  There  were no overt symptoms of withdrawal.  We extended the Librium detox.  A  family session with the fiancee they both agree to seek treatment.  We  went ahead and discharged to outpatient followup.   DISCHARGE DIAGNOSES:  AXIS I:  Alcohol dependence, substance-induced  anxiety and depression.  AXIS II:  No diagnosis.  AXIS III:  A 6 to 8 week intrauterine pregnancy.  AXIS IV:  Moderate.  AXIS V:  On discharge 50.   DISCHARGE MEDICATIONS:  Prenatal vitamin.  Librium to complete Librium  detox on an outpatient basis.      Geoffery Lyons, M.D.   Electronically Signed     IL/MEDQ  D:  10/18/2008  T:  10/18/2008  Job:  [id number]

## 2011-09-07 LAB — RAPID URINE DRUG SCREEN, HOSP PERFORMED
Barbiturates: NOT DETECTED
Cocaine: NOT DETECTED
Opiates: NOT DETECTED
Tetrahydrocannabinol: NOT DETECTED

## 2011-09-07 LAB — COMPREHENSIVE METABOLIC PANEL
Albumin: 3.7
Alkaline Phosphatase: 78
BUN: 7
CO2: 26
Chloride: 99
Creatinine, Ser: 0.67
GFR calc non Af Amer: >60
Glucose, Bld: 92
Potassium: 3 — ABNORMAL LOW
Total Bilirubin: 0.7

## 2011-09-07 LAB — ETHANOL
Alcohol, Ethyl (B): 331 — ABNORMAL HIGH
Alcohol, Ethyl (B): 483

## 2011-09-08 LAB — DIFFERENTIAL
Basophils Absolute: 0.1
Basophils Relative: 1
Eosinophils Absolute: 0
Eosinophils Absolute: 0
Eosinophils Absolute: 0
Eosinophils Relative: 0
Lymphocytes Relative: 50 — ABNORMAL HIGH
Lymphocytes Relative: 37
Lymphs Abs: 2.2
Lymphs Abs: 3.3
Lymphs Abs: 2
Monocytes Absolute: 0.2
Monocytes Relative: 3
Monocytes Relative: 6
Neutro Abs: 1.9
Neutro Abs: 3
Neutro Abs: 3
Neutrophils Relative %: 43
Neutrophils Relative %: 45
Neutrophils Relative %: 57

## 2011-09-08 LAB — COMPREHENSIVE METABOLIC PANEL
ALT: 54 — ABNORMAL HIGH
ALT: 106 — ABNORMAL HIGH
AST: 130 — ABNORMAL HIGH
AST: 158 — ABNORMAL HIGH
Albumin: 3.3 — ABNORMAL LOW
Albumin: 3.5
Alkaline Phosphatase: 161 — ABNORMAL HIGH
BUN: 4 — ABNORMAL LOW
BUN: 7
CO2: 27
CO2: 29
Calcium: 8 — ABNORMAL LOW
Calcium: 8.1 — ABNORMAL LOW
Calcium: 8.3 — ABNORMAL LOW
Chloride: 98
Creatinine, Ser: 0.72
Creatinine, Ser: 0.85
Creatinine, Ser: 0.63
GFR calc Af Amer: >60
GFR calc Af Amer: >60
GFR calc non Af Amer: >60
GFR calc non Af Amer: >60
Glucose, Bld: 70
Glucose, Bld: 89
Potassium: 3.8
Sodium: 138
Sodium: 145
Total Bilirubin: 0.8
Total Protein: 5.9 — ABNORMAL LOW
Total Protein: 6.6

## 2011-09-08 LAB — URINALYSIS, ROUTINE W REFLEX MICROSCOPIC
Bilirubin Urine: NEGATIVE
Bilirubin Urine: NEGATIVE
Glucose, UA: NEGATIVE
Glucose, UA: NEGATIVE
Hgb urine dipstick: NEGATIVE
Ketones, ur: NEGATIVE
Ketones, ur: NEGATIVE
Ketones, ur: NEGATIVE
Nitrite: NEGATIVE
Nitrite: POSITIVE — AB
Protein, ur: 30 — AB
Protein, ur: NEGATIVE
Protein, ur: NEGATIVE
Specific Gravity, Urine: 1.016
Urobilinogen, UA: 1
pH: 6.5
pH: 7.5

## 2011-09-08 LAB — CBC
HCT: 42.1
Hemoglobin: 14.8
Hemoglobin: 13.2
MCHC: 35.1
MCV: 87.6
MCV: 89.2
Platelets: 102 — ABNORMAL LOW
Platelets: 161
RBC: 4.7
RBC: 4.73
RBC: 4.16
RDW: 17.5 — ABNORMAL HIGH
RDW: 20.7 — ABNORMAL HIGH
WBC: 4.4
WBC: 6.5

## 2011-09-08 LAB — URINE CULTURE

## 2011-09-08 LAB — ETHANOL
Alcohol, Ethyl (B): 335 — ABNORMAL HIGH
Alcohol, Ethyl (B): 356 — ABNORMAL HIGH
Alcohol, Ethyl (B): 369 — ABNORMAL HIGH

## 2011-09-08 LAB — RAPID URINE DRUG SCREEN, HOSP PERFORMED
Amphetamines: NOT DETECTED
Amphetamines: NOT DETECTED
Barbiturates: NOT DETECTED
Benzodiazepines: NOT DETECTED
Benzodiazepines: POSITIVE — AB
Cocaine: NOT DETECTED
Opiates: NOT DETECTED
Tetrahydrocannabinol: NOT DETECTED

## 2011-09-08 LAB — PREGNANCY, URINE
Preg Test, Ur: NEGATIVE
Preg Test, Ur: NEGATIVE

## 2011-09-08 LAB — LIPASE, BLOOD
Lipase: 17
Lipase: 22

## 2011-09-08 LAB — URINE MICROSCOPIC-ADD ON

## 2011-09-12 LAB — POCT CARDIAC MARKERS
CKMB, poc: <1 — ABNORMAL LOW
Myoglobin, poc: 31
Operator id: 4531
Troponin i, poc: <0.05

## 2011-09-12 LAB — DIFFERENTIAL
Basophils Absolute: 0.1
Lymphocytes Relative: 55 — ABNORMAL HIGH
Neutro Abs: 1.7

## 2011-09-12 LAB — URINALYSIS, ROUTINE W REFLEX MICROSCOPIC
Glucose, UA: NEGATIVE
Ketones, ur: NEGATIVE
pH: 6.5

## 2011-09-12 LAB — COMPREHENSIVE METABOLIC PANEL
BUN: 3 — ABNORMAL LOW
CO2: 29
Chloride: 105
Creatinine, Ser: 0.58
GFR calc non Af Amer: >60
Glucose, Bld: 94
Total Bilirubin: 0.8

## 2011-09-12 LAB — RAPID URINE DRUG SCREEN, HOSP PERFORMED
Barbiturates: NOT DETECTED
Benzodiazepines: POSITIVE — AB

## 2011-09-12 LAB — CBC
HCT: 36.9
MCV: 107.7 — ABNORMAL HIGH
RBC: 3.43 — ABNORMAL LOW
WBC: 4.6

## 2011-09-12 LAB — URINE MICROSCOPIC-ADD ON

## 2011-09-13 LAB — COMPREHENSIVE METABOLIC PANEL
ALT: 32
Albumin: 4
Alkaline Phosphatase: 118 — ABNORMAL HIGH
Chloride: 105
Glucose, Bld: 113 — ABNORMAL HIGH
Potassium: 3.6
Sodium: 144
Total Bilirubin: 0.6
Total Protein: 7.8

## 2011-09-13 LAB — CBC
HCT: 34 — ABNORMAL LOW
HCT: 43.5
Hemoglobin: 11.8 — ABNORMAL LOW
MCHC: 34
MCHC: 34.6
MCV: 105.8 — ABNORMAL HIGH
MCV: 106.1 — ABNORMAL HIGH
Platelets: 147 — ABNORMAL LOW
RBC: 3.21 — ABNORMAL LOW
RBC: 4.1
RDW: 12.8
WBC: 5.7

## 2011-09-13 LAB — PROTIME-INR
INR: 0.9
Prothrombin Time: 12.5

## 2011-09-13 LAB — DIFFERENTIAL
Basophils Absolute: 0
Basophils Absolute: 0
Basophils Relative: 1
Basophils Relative: 1
Eosinophils Absolute: 0
Eosinophils Absolute: 0
Eosinophils Relative: 1
Lymphocytes Relative: 22
Lymphs Abs: 1.2
Monocytes Absolute: 0.3
Monocytes Absolute: 0.8
Monocytes Relative: 13 — ABNORMAL HIGH
Monocytes Relative: 7
Neutro Abs: 3.6
Neutrophils Relative %: 64

## 2011-09-13 LAB — URINALYSIS, ROUTINE W REFLEX MICROSCOPIC
Bilirubin Urine: NEGATIVE
Bilirubin Urine: NEGATIVE
Glucose, UA: NEGATIVE
Glucose, UA: NEGATIVE
Hgb urine dipstick: NEGATIVE
Hgb urine dipstick: NEGATIVE
Ketones, ur: NEGATIVE
Ketones, ur: NEGATIVE
Protein, ur: 30 — AB
Protein, ur: NEGATIVE

## 2011-09-13 LAB — RAPID URINE DRUG SCREEN, HOSP PERFORMED
Amphetamines: NOT DETECTED
Amphetamines: NOT DETECTED
Barbiturates: NOT DETECTED
Barbiturates: NOT DETECTED
Benzodiazepines: NOT DETECTED
Benzodiazepines: POSITIVE — AB
Cocaine: NOT DETECTED
Opiates: NOT DETECTED

## 2011-09-13 LAB — TYPE AND SCREEN
ABO/RH(D): O NEG
Antibody Screen: NEGATIVE

## 2011-09-13 LAB — ETHANOL
Alcohol, Ethyl (B): 146 — ABNORMAL HIGH
Alcohol, Ethyl (B): 261 — ABNORMAL HIGH

## 2011-09-13 LAB — BASIC METABOLIC PANEL WITH GFR
BUN: 1 — ABNORMAL LOW
Chloride: 105
GFR calc non Af Amer: >60
Potassium: 2.8 — ABNORMAL LOW
Sodium: 142

## 2011-09-13 LAB — ACETAMINOPHEN LEVEL: Acetaminophen (Tylenol), Serum: <10 — ABNORMAL LOW

## 2011-09-13 LAB — POCT CARDIAC MARKERS
Operator id: 261601
Troponin i, poc: <0.05

## 2011-09-13 LAB — BASIC METABOLIC PANEL
CO2: 27
Calcium: 9.3
Creatinine, Ser: 0.64
GFR calc Af Amer: >60
Glucose, Bld: 103 — ABNORMAL HIGH

## 2011-09-13 LAB — APTT: aPTT: 43 — ABNORMAL HIGH

## 2011-09-13 LAB — ABO/RH: ABO/RH(D): O NEG

## 2011-09-18 LAB — ETHANOL: Alcohol, Ethyl (B): 211 — ABNORMAL HIGH

## 2011-09-18 LAB — COMPREHENSIVE METABOLIC PANEL
Albumin: 3.9
BUN: 8
Chloride: 107
Creatinine, Ser: 0.69
GFR calc non Af Amer: >60
Glucose, Bld: 94
Total Bilirubin: 0.8

## 2011-09-18 LAB — DIFFERENTIAL
Basophils Absolute: 0.1
Basophils Relative: 1
Lymphocytes Relative: 37
Monocytes Absolute: 0.3
Neutro Abs: 5.1
Neutrophils Relative %: 58

## 2011-09-18 LAB — RAPID URINE DRUG SCREEN, HOSP PERFORMED
Barbiturates: NOT DETECTED
Benzodiazepines: NOT DETECTED
Cocaine: NOT DETECTED

## 2011-09-18 LAB — CBC
HCT: 39.2
Hemoglobin: 13.4
MCV: 93.2
Platelets: 299
RDW: 13.6
WBC: 8.8

## 2011-09-18 LAB — URINALYSIS, ROUTINE W REFLEX MICROSCOPIC
Bilirubin Urine: NEGATIVE
Hgb urine dipstick: NEGATIVE
Specific Gravity, Urine: 1.016
pH: 7

## 2011-09-18 LAB — URINE MICROSCOPIC-ADD ON

## 2011-09-19 LAB — URINE MICROSCOPIC-ADD ON

## 2011-09-19 LAB — CBC
Hemoglobin: 13.9
MCHC: 34
Platelets: 255
RBC: 4.43
RDW: 13.9

## 2011-09-19 LAB — URINALYSIS, ROUTINE W REFLEX MICROSCOPIC
Glucose, UA: NEGATIVE
Glucose, UA: NEGATIVE
Ketones, ur: NEGATIVE
Leukocytes, UA: NEGATIVE
Nitrite: NEGATIVE
Nitrite: NEGATIVE
Protein, ur: NEGATIVE
Protein, ur: NEGATIVE
Urobilinogen, UA: 0.2
Urobilinogen, UA: 0.2

## 2011-09-19 LAB — RHOGAM INJECTION

## 2011-09-19 LAB — ETHANOL
Alcohol, Ethyl (B): 308 — ABNORMAL HIGH
Alcohol, Ethyl (B): 260 — ABNORMAL HIGH

## 2011-09-19 LAB — ABO/RH: ABO/RH(D): O NEG

## 2011-09-19 LAB — WET PREP, GENITAL
Clue Cells Wet Prep HPF POC: NONE SEEN
Trich, Wet Prep: NONE SEEN
WBC, Wet Prep HPF POC: NONE SEEN
Yeast Wet Prep HPF POC: NONE SEEN

## 2011-09-19 LAB — HCG, QUANTITATIVE, PREGNANCY
hCG, Beta Chain, Quant, S: 19626 — ABNORMAL HIGH
hCG, Beta Chain, Quant, S: 2732 — ABNORMAL HIGH

## 2013-02-28 ENCOUNTER — Encounter (HOSPITAL_COMMUNITY): Payer: Self-pay

## 2013-02-28 ENCOUNTER — Emergency Department (HOSPITAL_COMMUNITY)
Admission: EM | Admit: 2013-02-28 | Discharge: 2013-02-28 | Disposition: A | Discharge status: Home / Self Care | Payer: Medicaid Other | Source: Home / Self Care

## 2013-02-28 DIAGNOSIS — I10 Essential (primary) hypertension: Secondary | ICD-10-CM

## 2013-02-28 HISTORY — DX: Disorder of thyroid, unspecified: E07.9

## 2013-02-28 MED ORDER — TRAZODONE HCL 100 MG PO TABS: 100.0000 mg | ORAL_TABLET | Freq: Every day | ORAL | Status: AC

## 2013-02-28 MED ORDER — NALTREXONE HCL 50 MG PO TABS: 50.0000 mg | ORAL_TABLET | Freq: Every day | ORAL | Status: AC

## 2013-02-28 MED ORDER — VENLAFAXINE HCL ER 150 MG PO CP24: 150.0000 mg | ORAL_CAPSULE | Freq: Every day | ORAL | Status: AC

## 2013-02-28 MED ORDER — LEVOTHYROXINE SODIUM 75 MCG PO TABS: 75.0000 ug | ORAL_TABLET | Freq: Every day | ORAL | Status: AC

## 2013-02-28 NOTE — ED Provider Notes (Signed)
History     CSN: [id number]  Arrival date & time 02/28/13  1150   None     Chief Complaint  Patient presents with  . Medication Refill    (Consider location/radiation/quality/duration/timing/severity/associated sxs/prior treatment) HPI Patient is 42 year old female who presents to clinic for followup and wants refills on medications. She denies recent sicknesses or hospitalizations, no chest pain or shortness of breath, no fevers or chills. Patient reports being in usual state of health, exercises and takes medicines appropriately. Past Medical History  Diagnosis Date  . Thyroid disease     History reviewed. No pertinent past surgical history.  No significant past family medical history.  History  Substance Use Topics  . Smoking status: Never Smoker   . Smokeless tobacco: Not on file  . Alcohol Use: No    OB History   Grav Para Term Preterm Abortions TAB SAB Ect Mult Living                  Review of Systems Constitutional: Negative for fever, chills, diaphoresis, activity change, appetite change and fatigue.  HENT: Negative for ear pain, nosebleeds, congestion, facial swelling, rhinorrhea, neck pain, neck stiffness and ear discharge.   Eyes: Negative for pain, discharge, redness, itching and visual disturbance.  Respiratory: Negative for cough, choking, chest tightness, shortness of breath, wheezing and stridor.   Cardiovascular: Negative for chest pain, palpitations and leg swelling.  Gastrointestinal: Negative for abdominal distention.  Genitourinary: Negative for dysuria, urgency, frequency, hematuria, flank pain, decreased urine volume, difficulty urinating and dyspareunia.  Musculoskeletal: Negative for back pain, joint swelling, arthralgias and gait problem.  Neurological: Negative for dizziness, tremors, seizures, syncope, facial asymmetry, speech difficulty, weakness, light-headedness, numbness and headaches.  Hematological: Negative for adenopathy. Does not  bruise/bleed easily.  Psychiatric/Behavioral: Negative for hallucinations, behavioral problems, confusion, dysphoric mood, decreased concentration and agitation.    Allergies  Levaquin  Home Medications   Current Outpatient Rx  Name  Route  Sig  Dispense  Refill  . levothyroxine (SYNTHROID, LEVOTHROID) 75 MCG tablet   Oral   Take 75 mcg by mouth daily.         . naltrexone (DEPADE) 50 MG tablet   Oral   Take 50 mg by mouth daily.         . traZODone (DESYREL) 100 MG tablet   Oral   Take 100 mg by mouth at bedtime.         Marland Kitchen venlafaxine XR (EFFEXOR-XR) 150 MG 24 hr capsule   Oral   Take 150 mg by mouth daily.           BP 108/51  Pulse 81  Temp(Src) 98.6 F (37 C) (Oral)  SpO2 100%  Physical Exam Constitutional: Appears well-developed and well-nourished. No distress.  HENT: Normocephalic. External right and left ear normal. Oropharynx is clear and moist.  Eyes: Conjunctivae and EOM are normal. PERRLA, no scleral icterus.  Neck: Normal ROM. Neck supple. No JVD. No tracheal deviation. No thyromegaly.  CVS: RRR, S1/S2 +, no murmurs, no gallops, no carotid bruit.  Pulmonary: Effort and breath sounds normal, no stridor, rhonchi, wheezes, rales.  Abdominal: Soft. BS +,  no distension, tenderness, rebound or guarding.  Musculoskeletal: Normal range of motion. No edema and no tenderness.  Lymphadenopathy: No lymphadenopathy noted, cervical, inguinal. Neuro: Alert. Normal reflexes, muscle tone coordination. No cranial nerve deficit. Skin: Skin is warm and dry. No rash noted. Not diaphoretic. No erythema. No pallor.  Psychiatric: Normal mood and affect. Behavior,  judgment, thought content normal.    ED Course  Procedures (including critical care time)  Labs Reviewed - No data to display No results found.  Hypothyroidism - This appears to be stable, we'll continue medication and provide refills Hyperlipidemia - Diet regimen discussed with patient and she  verbalized understanding Hypertension - Stable and well controlled at this point - Advised patient to monitor blood pressure regularly   MDM  Medication refill along with discussion of hyperlipidemia hypertension control        Dorothea Ogle, MD 02/28/13 1235

## 2013-02-28 NOTE — ED Notes (Signed)
Patient here for medication refill History of thyroid and substance abuse

## 2013-08-25 ENCOUNTER — Other Ambulatory Visit: Payer: Self-pay | Admitting: Emergency Medicine

## 2013-08-25 NOTE — Telephone Encounter (Signed)
Pt need to schedule appt for TSH blood work/office visit before rx refill Last seen 03/01/13

## 2014-02-05 ENCOUNTER — Other Ambulatory Visit: Payer: Self-pay | Admitting: Internal Medicine

## 2014-07-13 NOTE — ED Notes (Signed)
Patient called, asking for refills of her medications. Patient was advised we do not offer refills on any medication from this facility, and she will need to call her regular provider
